# Patient Record
Sex: Female | Born: 2005 | Race: White | Hispanic: Yes | Marital: Single | Smoking: Never smoker
Health system: Southern US, Community
[De-identification: ages and names within clinical notes are randomized; demographics above are authoritative.]

## PROBLEM LIST (undated history)

## (undated) DIAGNOSIS — R4589 Other symptoms and signs involving emotional state: Secondary | ICD-10-CM

## (undated) DIAGNOSIS — F419 Anxiety disorder, unspecified: Secondary | ICD-10-CM

## (undated) DIAGNOSIS — F321 Major depressive disorder, single episode, moderate: Secondary | ICD-10-CM

## (undated) DIAGNOSIS — F411 Generalized anxiety disorder: Secondary | ICD-10-CM

---
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
## 2023-01-03 ENCOUNTER — Ambulatory Visit
Admission: RE | Admit: 2023-01-03 | Discharge: 2023-01-03 | Disposition: A | Discharge status: Home / Self Care | Payer: Medicaid Other | Source: Ambulatory Visit | Attending: Internal Medicine | Admitting: Internal Medicine

## 2023-01-03 VITALS — BP 109/77 | HR 98 | Temp 99.2°F | Resp 17 | Wt 171.2 lb

## 2023-01-03 DIAGNOSIS — J309 Allergic rhinitis, unspecified: Secondary | ICD-10-CM | POA: Diagnosis present

## 2023-01-03 DIAGNOSIS — J029 Acute pharyngitis, unspecified: Secondary | ICD-10-CM | POA: Diagnosis not present

## 2023-01-03 LAB — POCT RAPID STREP A (OFFICE): Rapid Strep A Screen: NEGATIVE

## 2023-01-03 LAB — POCT MONO SCREEN (KUC): Mono, POC: NEGATIVE

## 2023-01-03 MED ORDER — CETIRIZINE HCL 10 MG PO TABS: 10.0000 mg | ORAL_TABLET | Freq: Every day | ORAL | 0 refills | Status: DC

## 2023-01-03 MED ORDER — FLUTICASONE PROPIONATE 50 MCG/ACT NA SUSP: 1.0000 | Freq: Every day | NASAL | 0 refills | Status: DC

## 2023-01-03 NOTE — Discharge Instructions (Signed)
Please start Flonase and cetirizine daily.  Lots of rest and fluids.  Please follow-up with the PCP ASAP for further evaluation of your symptoms as well as your weight loss.  Please try to eat regular meals and stay hydrated.  Please go to the ER if you develop any worsening symptoms before seeing your PCP.  I hope you feel better soon!

## 2023-01-03 NOTE — ED Provider Notes (Signed)
[facility]    CSN: [id number] Arrival date & time: 01/03/23  1602      History   Chief Complaint Chief Complaint  Patient presents with   Sore Throat    Entered by patient   Headache   Nausea    HPI Megan Jones is a 17 y.o. female  presents for evaluation of URI symptoms for 2 weeks. Patient reports associated symptoms of intermittent sore throat with headaches, congestion, nausea, diarrhea and chills. Denies vomiting, ear pain, body aches, shortness of breath. Patient does not have a hx of asthma. Patient does currently smoke.  Reports no sick contacts.  Patient also reports a somewhat intentional weight loss of 20 pounds in the past 3 weeks.  States she has been going to the gym and has not been eating very much.  Denies weight loss medications.  Pt has taken nothing OTC for symptoms. Pt has no other concerns at this time.    Sore Throat Associated symptoms include headaches.  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 No pertinent surgical history.  OB History   No obstetric history on file.      Home Medications    Prior to Admission medications   Medication Sig Start Date End Date Taking? Authorizing Provider  cetirizine (ZYRTEC) 10 MG tablet Take 1 tablet (10 mg total) by mouth daily. 01/03/23  Yes Radford Pax, NP  fluticasone (FLONASE) 50 MCG/ACT nasal spray Place 1 spray into both nostrils daily. 01/03/23  Yes Radford Pax, NP     Family History History reviewed. No pertinent family history.  Social History Social History   Tobacco Use   Smoking status: Never   Smokeless tobacco: Never  Vaping Use   Vaping status: Never Used  Substance Use Topics   Alcohol use: Not Currently   Drug use: No     Allergies   Penicillins   Review of Systems Review of Systems  Constitutional:  Positive for chills.  HENT:  Positive for congestion and sore throat.   Gastrointestinal:  Positive for diarrhea and nausea.  Neurological:  Positive for headaches.     Physical Exam Triage Vital Signs ED Triage Vitals  Encounter Vitals Group     BP 01/03/23 1620 109/77     Systolic BP Percentile --      Diastolic BP Percentile --      Pulse Rate 01/03/23 1620 98     Resp 01/03/23 1620 17     Temp 01/03/23 1620 99.2 F (37.3 C)     Temp Source 01/03/23 1620 Oral     SpO2 01/03/23 1620 96 %     Weight 01/03/23 1623 171 lb 3.2 oz (77.7 kg)     Height --      Head Circumference --      Peak Flow --      Pain Score 01/03/23 1619 1     Pain Loc --      Pain Education --  Exclude from Growth Chart --    No data found.  Updated Vital Signs BP 109/77 (BP Location: Right Arm)   Pulse 98   Temp 99.2 F (37.3 C) (Oral)   Resp 17   Wt 171 lb 3.2 oz (77.7 kg)   LMP 11/24/2022 (Exact Date)   SpO2 96%   Visual Acuity Right Eye Distance:   Left Eye Distance:   Bilateral Distance:    Right Eye Near:   Left Eye Near:    Bilateral Near:     Physical Exam Vitals and nursing note reviewed.  Constitutional:      General: She is not in acute distress.    Appearance: She is well-developed. She is not ill-appearing.  HENT:     Head: Normocephalic and atraumatic.     Right Ear: Tympanic membrane and ear canal normal.     Left Ear: Tympanic membrane and ear canal normal.     Nose: Congestion present.     Mouth/Throat:     Mouth: Mucous membranes are moist.     Pharynx: Oropharynx is clear. Uvula midline.  Posterior oropharyngeal erythema present.     Tonsils: No tonsillar exudate or tonsillar abscesses.  Eyes:     Conjunctiva/sclera: Conjunctivae normal.     Pupils: Pupils are equal, round, and reactive to light.  Cardiovascular:     Rate and Rhythm: Normal rate and regular rhythm.     Heart sounds: Normal heart sounds.  Pulmonary:     Effort: Pulmonary effort is normal.     Breath sounds: Normal breath sounds.  Musculoskeletal:     Cervical back: Normal range of motion and neck supple.  Lymphadenopathy:     Cervical: No cervical adenopathy.  Skin:    General: Skin is warm and dry.  Neurological:     General: No focal deficit present.     Mental Status: She is alert and oriented to person, place, and time.  Psychiatric:        Mood and Affect: Mood normal.        Behavior: Behavior normal.      UC Treatments / Results  Labs (all labs ordered are listed, but only abnormal results are displayed) Labs Reviewed  CULTURE, GROUP A STREP [facility])  POCT MONO SCREEN ([facility])  POCT RAPID STREP A (OFFICE)    EKG   Radiology No results found.  Procedures Procedures (including critical care time)  Medications Ordered in UC Medications - No data to display  Initial Impression / Assessment and Plan / UC Course  I have reviewed the triage vital signs and the nursing notes.  Pertinent labs & imaging results that were available during my care of the patient were reviewed by me and considered in my medical decision making (see chart for details).     Reviewed exam and symptoms with patient.  No red flags.  Negative rapid strep and mono.  Will send throat culture.  Discussed likely allergy related rhinitis/sore throat.  Trial of Flonase and cetirizine.  Unclear cause of reported 20 pound weight loss in 3 weeks.  I have no previous documented weights in our system from her previous visits.  Advised her to eat regular meals and to stay hydrated.  Advised patient to follow-up with PCP ASAP.   ER precautions reviewed and patient verbalized understanding. Final Clinical Impressions(s) / UC Diagnoses   Final diagnoses:  Sore throat  Allergic rhinitis, unspecified seasonality, unspecified trigger     Discharge Instructions  Please start Flonase and cetirizine daily.  Lots of rest and fluids.  Please follow-up with the PCP ASAP for further evaluation of your symptoms as well as your weight loss.  Please try to eat regular meals and stay hydrated.  Please go to the ER if you develop any worsening symptoms before seeing your PCP.  I hope you feel better soon!     ED Prescriptions     Medication Sig Dispense Auth. Provider   fluticasone (FLONASE) 50 MCG/ACT nasal spray Place 1 spray into both nostrils daily. 15.8 mL Radford Pax, NP   cetirizine (ZYRTEC) 10 MG tablet Take 1 tablet (10 mg total) by mouth daily. 30 tablet Radford Pax, NP      PDMP not reviewed this encounter.   Radford Pax, NP 01/03/23 [phone]

## 2023-01-03 NOTE — ED Triage Notes (Addendum)
Pt presents with c/o sore throat and headaches X 2 wks   C/o nausea x 1 wk.   Pt states she has lost 20lbs within 3 wks.

## 2023-01-06 LAB — CULTURE, GROUP A STREP (THRC)

## 2023-05-31 ENCOUNTER — Ambulatory Visit
Admission: RE | Admit: 2023-05-31 | Discharge: 2023-05-31 | Disposition: A | Discharge status: Home / Self Care | Source: Ambulatory Visit | Attending: Family Medicine | Admitting: Family Medicine

## 2023-05-31 VITALS — BP 110/74 | HR 62 | Temp 97.9°F | Resp 16

## 2023-05-31 DIAGNOSIS — K2900 Acute gastritis without bleeding: Secondary | ICD-10-CM | POA: Diagnosis not present

## 2023-05-31 DIAGNOSIS — R1010 Upper abdominal pain, unspecified: Secondary | ICD-10-CM

## 2023-05-31 LAB — POCT URINE PREGNANCY: Preg Test, Ur: NEGATIVE

## 2023-05-31 MED ORDER — FAMOTIDINE 20 MG PO TABS: 20.0000 mg | ORAL_TABLET | Freq: Two times a day (BID) | ORAL | 0 refills | Status: DC

## 2023-05-31 MED ORDER — OMEPRAZOLE 20 MG PO CPDR: 20.0000 mg | DELAYED_RELEASE_CAPSULE | Freq: Every day | ORAL | 0 refills | Status: DC

## 2023-05-31 NOTE — ED Triage Notes (Signed)
 Pt c/o n/v in the morning x ~2 months-NAD-steady gait

## 2023-05-31 NOTE — Discharge Instructions (Addendum)
 For diabetes or elevated blood sugar, please make sure you are limiting and avoiding starchy, carbohydrate foods like pasta, breads, sweet breads, pastry, rice, potatoes, desserts. These foods can elevate your blood sugar. Also, limit and avoid drinks that contain a lot of sugar such as sodas, sweet teas, fruit juices.  Drinking plain water will be much more helpful, try 80 ounces of water daily.  It is okay to flavor your water naturally by cutting cucumber, lemon, mint or lime, placing it in a picture with water and drinking it over a period of 24-48 hours as long as it remains refrigerated.  For elevated blood pressure, make sure you are monitoring salt in your diet.  Do not eat restaurant foods and limit processed foods at home. I highly recommend you prepare and cook your own foods at home.  Processed foods include things like frozen meals, pre-seasoned meats and dinners, deli meats, canned foods as these foods contain a high amount of sodium/salt.  Make sure you are paying attention to sodium labels on foods you buy at the grocery store. Buy your spices separately such as garlic powder, onion powder, cumin, cayenne, parsley flakes so that you can avoid seasonings that contain salt. However, salt-free seasonings are available and can be used, an example is Mrs. Dash and includes a lot of different mixtures that do not contain salt.  Lastly, when cooking using oils that are healthier for you is important. This includes olive oil, avocado oil, canola oil. We have discussed a lot of foods to avoid but below is a list of foods that can be very healthy to use in your diet whether it is for diabetes, cholesterol, high blood pressure, or in general healthy eating.  Salads - kale, spinach, cabbage, spring mix, arugula Fruits - avocadoes, berries (blueberries, raspberries, blackberries), apples, oranges, pomegranate, grapefruit, kiwi Vegetables - asparagus, cauliflower, broccoli, green beans, brussel sprouts,  bell peppers, beets; stay away from or limit starchy vegetables like potatoes, carrots, peas Other general foods - kidney beans, egg whites, almonds, walnuts, sunflower seeds, pumpkin seeds, fat free yogurt, almond milk, flax seeds, quinoa, oats  Meat - It is better to eat lean meats and limit your red meat including pork to once a week.  Wild caught fish, chicken breast are good options as they tend to be leaner sources of good protein. Still be mindful of the sodium labels for the meats you buy.  DO NOT EAT ANY FOODS ON THIS LIST THAT YOU ARE ALLERGIC TO. For more specific needs, I highly recommend consulting a dietician or nutritionist but this can definitely be a good starting point.

## 2023-05-31 NOTE — ED Provider Notes (Addendum)
 [facility]  Note:  This document was prepared using Conservation officer, historic buildings and may include unintentional dictation errors.  MRN: [id number] DOB: [birth year]  Subjective:   Megan Jones is a 18 y.o. female presenting for 53-month history of persistent intermittent nausea, vomiting, upper abdominal pain.  Patient reports that her symptoms are worse in the morning.  LMP was 05/20/2023.  No fever, bloody stools, recent antibiotic use, hospitalizations or long distance travel.  Has not eaten raw foods, drank unfiltered water.  No history of GI disorders including Crohn's, IBS, ulcerative colitis.  No history of gallbladder disease, abdominal surgeries.  Patient reports very poor eating habits, reports that she eats fast food regularly if not daily.  She was also smoking marijuana consistently up until 2 weeks ago.  She drinks energy drinks regularly.  No current facility-administered medications for this encounter.  Current Outpatient Medications:    cetirizine (ZYRTEC) 10 MG tablet, Take 1 tablet (10 mg total) by mouth daily., Disp: 30 tablet, Rfl: 0   fluticasone (FLONASE) 50 MCG/ACT nasal spray, Place 1 spray into both nostrils daily., Disp: 15.8 mL, Rfl: 0   Allergies  Allergen Reactions   Penicillins Rash    Has patient had a PCN reaction causing immediate rash, facial/tongue/throat swelling, SOB or lightheadedness with hypotension: Yes Has patient had a PCN reaction causing severe rash involving mucus membranes or skin necrosis: No Has patient had a PCN reaction that required hospitalization No Has patient had a PCN reaction occurring within the last 10 years: No If all of the above answers are "NO", then may proceed with Cephalosporin use.     Past Medical History:  Diagnosis Date   Anxiety    Generalized anxiety disorder 02/11/2016   MDD (major depressive disorder), single episode, moderate (HCC) 02/11/2016   Non-suicidal self harm  02/11/2016     History reviewed. No pertinent surgical history.  No family history on file.  Social History   Tobacco Use   Smoking status: Never   Smokeless tobacco: Never  Vaping Use   Vaping status: Never Used  Substance Use Topics   Alcohol use: Not Currently   Drug use: No    ROS   Objective:   Vitals: BP 110/74 (BP Location: Right Arm)   Pulse 62   Temp 97.9 F (36.6 C) (Oral)   Resp 16   LMP 05/20/2023   SpO2 98%   Physical Exam Constitutional:      General: She is not in acute distress.    Appearance: Normal appearance. She is well-developed. She is not ill-appearing, toxic-appearing or diaphoretic.  HENT:     Head: Normocephalic and atraumatic.     Nose: Nose normal.     Mouth/Throat:     Mouth: Mucous membranes are moist.  Eyes:     General: No scleral icterus.       Right eye: No discharge.        Left eye: No discharge.     Extraocular Movements: Extraocular movements intact.     Conjunctiva/sclera: Conjunctivae normal.  Cardiovascular:     Rate and Rhythm: Normal rate.  Pulmonary:     Effort: Pulmonary effort is normal.  Abdominal:     General: Bowel sounds are normal. There is no distension.     Palpations: Abdomen is soft. There is no mass.     Tenderness: There is abdominal tenderness (mild epigastric area). There is no right CVA tenderness, left CVA tenderness, guarding or rebound.  Skin:    General: Skin is warm and dry.  Neurological:     General: No focal deficit present.     Mental Status: She is alert and oriented to person, place, and time.  Psychiatric:        Mood and Affect: Mood normal.        Behavior: Behavior normal.        Thought Content: Thought content normal.        Judgment: Judgment normal.     Results for orders placed or performed during the hospital encounter of 05/31/23 (from the past 24 hours)  POCT urine pregnancy     Status: None   Collection Time: 05/31/23  3:04 PM  Result Value Ref Range   Preg  Test, Ur Negative Negative    Assessment and Plan :   PDMP not reviewed this encounter.  1. Acute gastritis without hemorrhage, unspecified gastritis type   2. Upper abdominal pain    Suspect inflammatory process likely secondary to her eating habits.  Recommend significant dietary modifications.  Start Prilosec, famotidine.  Counseled patient on potential for adverse effects with medications prescribed/recommended today, ER and return-to-clinic precautions discussed, patient verbalized understanding.  I am pursuing general blood work and will review test results with patient when I return on Friday.   Wallis Bamberg, [place] 05/31/23 [phone]

## 2023-06-01 LAB — CBC
Hematocrit: 43.3 % (ref 34.0–46.6)
Hemoglobin: 14.3 g/dL (ref 11.1–15.9)
MCH: 31.1 pg (ref 26.6–33.0)
MCHC: 33 g/dL (ref 31.5–35.7)
MCV: 94 fL (ref 79–97)
Platelets: 411 10*3/uL (ref 150–450)
RBC: 4.6 x10E6/uL (ref 3.77–5.28)
RDW: 12.2 % (ref 11.7–15.4)
WBC: 7.9 10*3/uL (ref 3.4–10.8)

## 2023-06-01 LAB — COMPREHENSIVE METABOLIC PANEL WITH GFR
ALT: 12 IU/L (ref 0–32)
AST: 19 IU/L (ref 0–40)
Albumin: 4.7 g/dL (ref 4.0–5.0)
Alkaline Phosphatase: 76 IU/L (ref 42–106)
BUN/Creatinine Ratio: 23 (ref 9–23)
BUN: 12 mg/dL (ref 6–20)
Bilirubin Total: 0.3 mg/dL (ref 0.0–1.2)
CO2: 22 mmol/L (ref 20–29)
Calcium: 9.3 mg/dL (ref 8.7–10.2)
Chloride: 103 mmol/L (ref 96–106)
Creatinine, Ser: 0.52 mg/dL — ABNORMAL LOW (ref 0.57–1.00)
Globulin, Total: 2.5 g/dL (ref 1.5–4.5)
Glucose: 77 mg/dL (ref 70–99)
Potassium: 4.6 mmol/L (ref 3.5–5.2)
Sodium: 137 mmol/L (ref 134–144)
Total Protein: 7.2 g/dL (ref 6.0–8.5)
eGFR: 137 mL/min/{1.73_m2} (ref >59)

## 2023-06-01 LAB — LIPASE: Lipase: 25 U/L (ref 14–72)

## 2023-08-02 ENCOUNTER — Ambulatory Visit
Admission: RE | Admit: 2023-08-02 | Discharge: 2023-08-02 | Disposition: A | Discharge status: Home / Self Care | Source: Ambulatory Visit | Attending: Family Medicine | Admitting: Family Medicine

## 2023-08-02 ENCOUNTER — Other Ambulatory Visit: Payer: Self-pay

## 2023-08-02 VITALS — BP 120/81 | HR 83 | Temp 99.0°F | Resp 16

## 2023-08-02 DIAGNOSIS — S70319A Abrasion, unspecified thigh, initial encounter: Secondary | ICD-10-CM | POA: Diagnosis not present

## 2023-08-02 DIAGNOSIS — Z0289 Encounter for other administrative examinations: Secondary | ICD-10-CM | POA: Diagnosis not present

## 2023-08-02 NOTE — Discharge Instructions (Signed)
 You may do cool compresses to the area as needed.  Monitor for any signs of infection which include but are not limited to redness drainage swelling warmth and fevers and seek reevaluation if these occur.  Follow-up with your PCP if your symptoms do not improve.  Please go to the ER for any worsening symptoms.  Hope you feel better soon!

## 2023-08-02 NOTE — ED Triage Notes (Signed)
 Pt states she was trying to climb over a fence and started to fall off the top, but caught herself and injured the posterior of her right upper leg. There is no bruising or wounds noted

## 2023-08-02 NOTE — ED Provider Notes (Signed)
 [facility]    CSN: [id number] Arrival date & time: 08/02/23  1434      History   Chief Complaint Chief Complaint  Patient presents with   Letter for School/Work    Entered by patient    HPI Megan Jones is a 18 y.o. female presents for work note.  Patient states yesterday she was attempting to climb over a chain link notes when she got caught at the top causing an abrasion to her right posterior thigh.  States she has had some soreness to the area but no bruising, drainage, swelling, warmth.  She has been using OTC analgesics as needed.  She primarily needs a work excuse.  No other injuries or concerns at this time.  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 No pertinent surgical history.  OB History   No obstetric history on file.      Home Medications    Prior to Admission medications   Medication Sig Start Date End Date Taking? Authorizing Provider  cetirizine  (ZYRTEC ) 10 MG tablet Take 1 tablet (10 mg total) by mouth daily. 01/03/23   Lidya Mccalister, Jodi R, NP  famotidine  (PEPCID ) 20 MG tablet Take 1 tablet (20 mg total) by mouth 2 (two) times daily. 05/31/23   Adolph Hoop, PA-C  fluticasone  (FLONASE ) 50 MCG/ACT nasal spray Place 1 spray into both nostrils daily. 01/03/23   Aditya Nastasi, Jodi R, NP  omeprazole  (PRILOSEC) 20 MG capsule Take 1 capsule (20 mg total) by mouth daily. 05/31/23   Adolph Hoop, PA-C    Family History History reviewed. No pertinent family history.  Social History Social History   Tobacco Use   Smoking  status: Every Day    Types: Cigarettes   Smokeless tobacco: Never  Vaping Use   Vaping status: Never Used  Substance Use Topics   Alcohol use: Not Currently   Drug use: No     Allergies   Penicillins   Review of Systems Review of Systems  Skin:  Positive for wound.     Physical Exam Triage Vital Signs ED Triage Vitals  Encounter Vitals Group     BP 08/02/23 1448 120/81     Systolic BP Percentile --      Diastolic BP Percentile --      Pulse Rate 08/02/23 1448 83     Resp 08/02/23 1448 16     Temp 08/02/23 1448 99 F (37.2 C)     Temp Source 08/02/23 1448 Oral     SpO2 08/02/23 1448 97 %     Weight --      Height --      Head Circumference --      Peak Flow --      Pain Score 08/02/23 1447 0     Pain Loc --      Pain Education --      Exclude from Growth Chart --    No data found.  Updated Vital Signs BP 120/81   Pulse 83   Temp 99 F (37.2 C) (Oral)   Resp 16  LMP 07/26/2023   SpO2 97%   Visual Acuity Right Eye Distance:   Left Eye Distance:   Bilateral Distance:    Right Eye Near:   Left Eye Near:    Bilateral Near:     Physical Exam Vitals and nursing note reviewed.  Constitutional:      General: She is not in acute distress.    Appearance: Normal appearance. She is not ill-appearing.  HENT:     Head: Normocephalic and atraumatic.  Eyes:     Pupils: Pupils are equal, round, and reactive to light.  Cardiovascular:     Rate and Rhythm: Normal rate.  Pulmonary:     Effort: Pulmonary effort is normal.  Musculoskeletal:       Legs:     Comments: There is a very small less than 0.25 cm abrasion to the right posterior thigh.  Very mildly tender to palpation.  There is no bruising, swelling, erythema, warmth or drainage.  Skin:    General: Skin is warm and dry.  Neurological:     General: No focal deficit present.     Mental Status: She is alert and oriented to person, place, and time.  Psychiatric:        Mood and Affect: Mood normal.         Behavior: Behavior normal.      UC Treatments / Results  Labs (all labs ordered are listed, but only abnormal results are displayed) Labs Reviewed - No data to display  EKG   Radiology No results found.  Procedures Procedures (including critical care time)  Medications Ordered in UC Medications - No data to display  Initial Impression / Assessment and Plan / UC Course  I have reviewed the triage vital signs and the nursing notes.  Pertinent labs & imaging results that were available during my care of the patient were reviewed by me and considered in my medical decision making (see chart for details).     Reviewed exam and symptoms with patient.  No red flags.  Small abrasion to posterior thigh without signs of infection.  Discussed RICE therapy and continuation of OTC analgesics.  Work note provided.  Patient to follow-up with PCP if symptoms do not ER precautions reviewed. Final Clinical Impressions(s) / UC Diagnoses   Final diagnoses:  Encounter to obtain excuse from work  Abrasion, thigh w/o infection     Discharge Instructions      You may do cool compresses to the area as needed.  Monitor for any signs of infection which include but are not limited to redness drainage swelling warmth and fevers and seek reevaluation if these occur.  Follow-up with your PCP if your symptoms do not improve.  Please go to the ER for any worsening symptoms.  Hope you feel better soon!  ED Prescriptions   None    PDMP not reviewed this encounter.   Alleen Arbour, NP 08/02/23 [phone]

## 2023-10-08 ENCOUNTER — Ambulatory Visit (HOSPITAL_COMMUNITY): Payer: Self-pay

## 2023-12-02 ENCOUNTER — Ambulatory Visit
Admission: RE | Admit: 2023-12-02 | Discharge: 2023-12-02 | Disposition: A | Discharge status: Home / Self Care | Source: Ambulatory Visit | Attending: Physician Assistant | Admitting: Physician Assistant

## 2023-12-02 VITALS — BP 122/84 | HR 66 | Temp 98.3°F | Resp 16

## 2023-12-02 DIAGNOSIS — F411 Generalized anxiety disorder: Secondary | ICD-10-CM

## 2023-12-02 DIAGNOSIS — F439 Reaction to severe stress, unspecified: Secondary | ICD-10-CM

## 2023-12-02 MED ORDER — HYDROXYZINE HCL 25 MG PO TABS: 25.0000 mg | ORAL_TABLET | Freq: Two times a day (BID) | ORAL | 0 refills | Status: AC | PRN

## 2023-12-02 NOTE — ED Provider Notes (Signed)
 [facility]    CSN: [id number] Arrival date & time: 12/02/23  1236      History   Chief Complaint Chief Complaint  Patient presents with   Anxiety    Entered by patient    HPI Megan Jones is a 18 y.o. female.   Patient presents today with a several month history of increasing situational stress and anxiety.  Over the past few days it has become more bothersome to the point that she has had to call out of work.  Her mother is going to be leaving the country within the next few months which she has known about but it is becoming closer to the time causing her to have worsening symptoms.  She does have a history of anxiety when she was younger and was seen by a therapist when she was in middle school but has not had significant mental health symptoms in adulthood.  She denies any thoughts of wanting to hurt herself or anyone else.  She is never been hospitalized for mental health condition.  She becomes overwhelmingly sad and distressed when thinking about her mother leaving.  She does have support in 2 older sisters who will remain in the area.  She became so upset last night that she could not sleep and so missed work today.  She is requesting a work excuse note today.    Past Medical History:  Diagnosis Date   Anxiety    Generalized anxiety disorder 02/11/2016   MDD (major depressive disorder), single episode, moderate (HCC) 02/11/2016   Non-suicidal self harm 02/11/2016    Patient Active Problem List   Diagnosis Date Noted   Eating disorder 03/28/2016   Generalized anxiety disorder 02/11/2016   MDD (major depressive disorder), single episode, moderate (HCC) 02/11/2016   Non-suicidal self-harm (HCC) 02/11/2016    History reviewed. No pertinent surgical history.  OB History   No obstetric history on file.      Home Medications    Prior to Admission medications   Medication Sig Start Date End Date Taking? Authorizing Provider  hydrOXYzine  (ATARAX) 25 MG tablet Take 1 tablet (25 mg total) by mouth 2 (two) times daily as needed for anxiety. 12/02/23  Yes Davin Archuletta, Rocky POUR, PA-C    Family History History reviewed. No pertinent family history.  Social History Social History   Tobacco Use   Smoking status: Never   Smokeless tobacco: Never  Vaping Use   Vaping status: Some Days  Substance Use Topics   Alcohol use: Not Currently    Comment: Rarley, do not like the taste   Drug use: Never     Allergies   Penicillins   Review of Systems Review of Systems  Constitutional:  Positive for activity change. Negative for appetite change, fatigue and fever.  Respiratory:  Negative for shortness of breath.   Cardiovascular:  Negative for chest pain and palpitations.  Psychiatric/Behavioral:  Positive for sleep disturbance. Negative for agitation, decreased concentration, dysphoric mood, self-injury and suicidal ideas. The patient is nervous/anxious.      Physical Exam Triage Vital Signs ED Triage Vitals  Encounter Vitals Group     BP 12/02/23 1344 122/84     Girls Systolic BP Percentile --      Girls Diastolic BP Percentile --      Boys Systolic BP Percentile --      Boys Diastolic BP Percentile --      Pulse Rate 12/02/23 1344 66     Resp 12/02/23 1344  16     Temp 12/02/23 1344 98.3 F (36.8 C)     Temp Source 12/02/23 1344 Oral     SpO2 12/02/23 1344 97 %     Weight --      Height --      Head Circumference --      Peak Flow --      Pain Score 12/02/23 1345 0     Pain Loc --      Pain Education --      Exclude from Growth Chart --    No data found.  Updated Vital Signs BP 122/84 (BP Location: Left Arm)   Pulse 66   Temp 98.3 F (36.8 C) (Oral)   Resp 16   SpO2 97%   Visual Acuity Right Eye Distance:   Left Eye Distance:   Bilateral Distance:    Right Eye Near:   Left Eye Near:    Bilateral Near:     Physical Exam Vitals reviewed.  Constitutional:      General: She is awake. She is not in  acute distress.    Appearance: Normal appearance. She is well-developed. She is not ill-appearing.     Comments: Very pleasant female appears in today to no acute distress sitting comfortably in exam room  HENT:     Head: Normocephalic and atraumatic.  Cardiovascular:     Rate and Rhythm: Normal rate and regular rhythm.     Heart sounds: Normal heart sounds, S1 normal and S2 normal. No murmur heard. Pulmonary:     Effort: Pulmonary effort is normal.     Breath sounds: Normal breath sounds. No wheezing, rhonchi or rales.     Comments: Clear to auscultation bilaterally Abdominal:     General: Bowel sounds are normal.     Palpations: Abdomen is soft.     Tenderness: There is no abdominal tenderness. There is no right CVA tenderness, left CVA tenderness, guarding or rebound.  Psychiatric:        Attention and Perception: Attention normal.        Mood and Affect: Mood is anxious and depressed. Affect is tearful.        Speech: Speech normal.        Behavior: Behavior is cooperative.     Comments: Normal appearance as hygiene.  Maintains eye contact.  Appropriate speech.  Tearful throughout exam.          UC Treatments / Results  Labs (all labs ordered are listed, but only abnormal results are displayed) Labs Reviewed - No data to display  EKG   Radiology No results found.  Procedures Procedures (including critical care time)  Medications Ordered in UC Medications - No data to display  Initial Impression / Assessment and Plan / UC Course  I have reviewed the triage vital signs and the nursing notes.  Pertinent labs & imaging results that were available during my care of the patient were reviewed by me and considered in my medical decision making (see chart for details).     Patient is well-appearing, afebrile, nontoxic, nontachycardic.  She denies any thoughts of wanting to harm herself or others that would warrant involuntary commitment.  We discussed that symptoms  are likely a reaction to her situational stressors.  I do think it is reasonable to see a therapist and she was already given the contact information for someone by a trusted family member and encouraged to call them to schedule an appointment.  She was also given  the contact information for behavioral urgent care in case her symptoms persist or worsen.  We will use hydroxyzine to help manage acute symptoms as well as sleep disruption.  We discussed that this can be sedating and she is not to drive drink alcohol taking it.  If she has any worsening symptoms including thoughts of point herself or anyone else she is to be seen emergently.  Strict return precautions given.  Excuse note provided.  Final Clinical Impressions(s) / UC Diagnoses   Final diagnoses:  Situational stress  Anxiety state     Discharge Instructions      I am sorry that you are going through this.  I do think it is reasonable to follow-up with a therapist as your sister recommended.  I have also given you the information for behavioral health urgent care in case you have difficulty getting in with that therapist and your symptoms worsen.  Take Atarax/hydroxyzine up to twice a day.  This will make you sleepy.  If you have any thoughts of wanting to hurt yourself or anyone else please go to the emergency room.     ED Prescriptions     Medication Sig Dispense Auth. Provider   hydrOXYzine (ATARAX) 25 MG tablet Take 1 tablet (25 mg total) by mouth 2 (two) times daily as needed for anxiety. 12 tablet Carry Weesner K, PA-C      PDMP not reviewed this encounter.   Sherrell Rocky POUR, PA-C 12/02/23 1411

## 2023-12-02 NOTE — ED Triage Notes (Signed)
 Pt reports she is having anxiety for the pass 3 days. Reports she had anxiety bad: when she was younger and was admitted to the hospital due to anxiety.  Reports she took medication for anxiety, she think it was Lexapro, she was in middle school. States she feels anxious, her mind goes on and on, having trouble sleeping,I solating. Denies any suicidal or self harm thoughts.

## 2023-12-02 NOTE — Discharge Instructions (Signed)
 I am sorry that you are going through this.  I do think it is reasonable to follow-up with a therapist as your sister recommended.  I have also given you the information for behavioral health urgent care in case you have difficulty getting in with that therapist and your symptoms worsen.  Take Atarax/hydroxyzine up to twice a day.  This will make you sleepy.  If you have any thoughts of wanting to hurt yourself or anyone else please go to the emergency room.

## 2024-03-23 ENCOUNTER — Ambulatory Visit (HOSPITAL_COMMUNITY): Payer: Self-pay
